# Patient Record
Sex: Male | Born: 1990 | Race: Black or African American | Hispanic: No | Marital: Single | Smoking: Former smoker
Health system: Southern US, Community
[De-identification: ages and names within clinical notes are randomized; demographics above are authoritative.]

## PROBLEM LIST (undated history)

## (undated) DIAGNOSIS — K649 Unspecified hemorrhoids: Secondary | ICD-10-CM

---

## 2012-06-13 ENCOUNTER — Encounter (HOSPITAL_COMMUNITY): Payer: Self-pay | Admitting: Emergency Medicine

## 2012-06-13 ENCOUNTER — Emergency Department (HOSPITAL_COMMUNITY)
Admission: EM | Admit: 2012-06-13 | Discharge: 2012-06-13 | Disposition: A | Discharge status: Home / Self Care | Payer: Self-pay | Attending: Emergency Medicine | Admitting: Emergency Medicine

## 2012-06-13 ENCOUNTER — Emergency Department (HOSPITAL_COMMUNITY): Payer: Self-pay

## 2012-06-13 DIAGNOSIS — Z880 Allergy status to penicillin: Secondary | ICD-10-CM | POA: Insufficient documentation

## 2012-06-13 DIAGNOSIS — S8391XA Sprain of unspecified site of right knee, initial encounter: Secondary | ICD-10-CM

## 2012-06-13 DIAGNOSIS — IMO0002 Reserved for concepts with insufficient information to code with codable children: Secondary | ICD-10-CM | POA: Insufficient documentation

## 2012-06-13 DIAGNOSIS — W010XXA Fall on same level from slipping, tripping and stumbling without subsequent striking against object, initial encounter: Secondary | ICD-10-CM | POA: Insufficient documentation

## 2012-06-13 MED ORDER — HYDROCODONE-ACETAMINOPHEN 5-500 MG PO TABS: 1.0000 | ORAL_TABLET | Freq: Four times a day (QID) | ORAL | Status: DC | PRN

## 2012-06-13 MED ORDER — NAPROXEN 500 MG PO TABS: 500.0000 mg | ORAL_TABLET | Freq: Two times a day (BID) | ORAL | Status: DC

## 2012-06-13 NOTE — Progress Notes (Signed)
Orthopedic Tech Progress Note Patient Details:  Philip York [birth year] [id number]  Ortho Devices Type of Ortho Device: Crutches;Knee Immobilizer Ortho Device/Splint Location: right knee immobilizer Ortho Device/Splint Interventions: Application   Cammer, Mickie Bail 06/13/2012, 11:25 AM

## 2012-06-13 NOTE — ED Notes (Signed)
Pt c/o right knee pain after falling onto knee yesterday

## 2012-06-13 NOTE — ED Provider Notes (Signed)
History     CSN: [id number]  Arrival date & time 06/13/12  6433   First MD Initiated Contact with Patient 06/13/12 0945      No chief complaint on file.   (Consider location/radiation/quality/duration/timing/severity/associated sxs/prior treatment) HPI Comments: Philip York is a 21 y.o. Male who presents with right knee pain. Pt states he was walking yesterday and tripped over his flip flop falling onto hard wood floor. States fell onto right knee. States pain in the knee since then. unable to walk on it. Pain with movement. No obvious swelling or bruising. States could not sleep due to pain. No other complaints or injuries. Denies cold or numbness to the foot.      No past medical history on file.  No past surgical history on file.  No family history on file.  History  Substance Use Topics  . Smoking status: Not on file  . Smokeless tobacco: Not on file  . Alcohol Use: Not on file      Review of Systems  Constitutional: Negative for fever and chills.  HENT: Negative for neck pain and neck stiffness.   Respiratory: Negative.   Cardiovascular: Negative.   Musculoskeletal: Positive for arthralgias.  Skin: Negative.   Neurological: Negative for weakness and numbness.    Allergies  Penicillins and Z-pak  Home Medications  No current outpatient prescriptions on file.  There were no vitals taken for this visit.  Physical Exam  Nursing note and vitals reviewed. Constitutional: He appears well-developed and well-nourished. No distress.       Pt obese  Cardiovascular: Normal rate, regular rhythm and normal heart sounds.   Pulmonary/Chest: Effort normal and breath sounds normal. No respiratory distress. He has no wheezes. He has no rales.  Musculoskeletal:       Right knee normal appearing when compared to the left. No tenderness over anterior knee, patella, patella tendon, or joint spaces. No tenderness over posterior joint. Pain with ROM, unable to flex over 90  degress, full extension. Joint stable with negative anterior, posterior drawer signs, no laxity with medial or lateral stress. Normal and equal dorsal pedal pulses bilaterally  Neurological: He is alert.  Skin: Skin is warm and dry.    ED Course  Procedures (including critical care time)  Pt with right knee injury. Exam shows a stable joint, no swelling, bruising, erythema. X-ray pending.   No results found for this or any previous visit. Dg Knee Complete 4 Views Right  06/13/2012  *RADIOLOGY REPORT*  Clinical Data: , knee pain, fall  RIGHT KNEE - COMPLETE 4+ VIEW  Comparison: None  Findings: Four views of the right knee submitted.  No acute fracture or subluxation.  No radiopaque foreign body.  IMPRESSION: No acute fracture or subluxation.   Original Report Authenticated By: Natasha Mead, M.D.     Pt's x-ray negative. Knee immobilizer and crutches provided. Follow up with orthopedics. Based on my exam, suspect knee sprain.   1. Sprain of right knee       MDM          Lottie Mussel, PA 06/13/12 1629

## 2012-06-13 NOTE — ED Notes (Signed)
Spoke with ortho who will apply knee  immobilizer and crutches

## 2012-06-14 NOTE — ED Provider Notes (Signed)
Medical screening examination/treatment/procedure(s) were performed by non-physician practitioner and as supervising physician I was immediately available for consultation/collaboration.  Jones Skene, M.D.      Jones Skene, MD 06/14/12 1255

## 2017-07-05 ENCOUNTER — Emergency Department
Admission: EM | Admit: 2017-07-05 | Discharge: 2017-07-06 | Disposition: A | Discharge status: Home / Self Care | Payer: Self-pay | Attending: Emergency Medicine | Admitting: Emergency Medicine

## 2017-07-05 ENCOUNTER — Encounter: Payer: Self-pay | Admitting: Emergency Medicine

## 2017-07-05 DIAGNOSIS — K649 Unspecified hemorrhoids: Secondary | ICD-10-CM | POA: Insufficient documentation

## 2017-07-05 DIAGNOSIS — K5900 Constipation, unspecified: Secondary | ICD-10-CM | POA: Insufficient documentation

## 2017-07-05 DIAGNOSIS — F17200 Nicotine dependence, unspecified, uncomplicated: Secondary | ICD-10-CM | POA: Insufficient documentation

## 2017-07-05 MED ORDER — HYDROCORTISONE ACETATE 25 MG RE SUPP: 25.0000 mg | Freq: Two times a day (BID) | RECTAL | 0 refills | Status: DC | PRN

## 2017-07-05 MED ORDER — LIDOCAINE (ANORECTAL) 5 % EX GEL: 1.0000 "application " | Freq: Three times a day (TID) | CUTANEOUS | 0 refills | Status: DC | PRN

## 2017-07-05 NOTE — Discharge Instructions (Signed)
Increase fiber in her diet as well as water intake.  You may take MiraLAX over the next 5-7 days to decrease constipation symptoms.  If symptoms do not improve do not hesitate to follow-up with her primary care or return to the emergency department.

## 2017-07-05 NOTE — ED Triage Notes (Signed)
Pt reports he tried to have a BM today when he was not able to pass the stool easily and when wiping after BM, pt noticed blood on paper towel. Per pt, he has HX of hemorrhoids and sts he has had similar happen in past but this time there was no pain. Per pt, there was only one episode of this .

## 2017-07-05 NOTE — ED Provider Notes (Signed)
[facility] Emergency Department Provider Note   ____________________________________________   I have reviewed the triage vital signs and the nursing notes.   HISTORY  Chief Complaint Constipation    HPI Philip York is a 26 y.o. male presents to the emergency department after seeing bright red blood on toilet paper after attempting a bowel movement at work. Patient was lifting heavy boxes and noted feeling a sharp pain in his abdomen followed by a sense of needing to have a bowel movement. Patient's first attempt for bowel movement was unsuccessful. The second time he passed stool and noticed bright red blood on the toilet paper. Patient noted blood was minimal. Patient reported history of hemorrhoids when he was in high school that resolved with conservative treatment. Patient reported tonight symptoms were similar to symptoms he experienced in high school. Patient came in to be evaluated because his work involves heavy lifting and he was concerned continued lifting tonight could worsen symptoms. Patient reports increased constipation of lately he contributes this to current diet. Patient has no significant medical history and is otherwise healthy. Patient denies fever, chills, headache, vision changes, chest pain, chest tightness, shortness of breath, abdominal pain, nausea and vomiting.  History reviewed. No pertinent past medical history.  There are no active problems to display for this patient.   History reviewed. No pertinent surgical history.  Prior to Admission medications   Medication Sig Start Date End Date Taking? Authorizing Provider  HYDROcodone-acetaminophen (VICODIN) 5-500 MG per tablet Take 1-2 tablets by mouth every 6 (six) hours as needed for pain. 06/13/12   Kirichenko, Lemont Fillersatyana, PA-C  hydrocortisone (ANUSOL-HC) 25 MG suppository Place 1 suppository (25 mg total) rectally 2 (two) times daily as needed for hemorrhoids or anal itching.  07/05/17   Little, Traci M, PA-C  Lidocaine, Anorectal, 5 % GEL Apply 1 application topically 3 (three) times daily as needed. 07/05/17   Little, Traci M, PA-C  naproxen (NAPROSYN) 500 MG tablet Take 1 tablet (500 mg total) by mouth 2 (two) times daily. 06/13/12   Kirichenko, Lemont Fillersatyana, PA-C    Allergies Penicillins and Z-pak [azithromycin]  History reviewed. No pertinent family history.  Social History Social History  Substance Use Topics  . Smoking status: Current Every Day Smoker  . Smokeless tobacco: Never Used  . Alcohol use No    Review of Systems Constitutional: Negative for fever/chills Eyes: No visual changes. Cardiovascular: Denies chest pain. Gastrointestinal: No abdominal pain.  No nausea, vomiting, diarrhea. Positive for constipation. Positive for bright red blood after passing hard stool during a bowel movement. Genitourinary: Negative for dysuria. Musculoskeletal: Negative for back pain. Skin: Negative for rash. Neurological: Negative for headaches. ____________________________________________   PHYSICAL EXAM:  VITAL SIGNS: ED Triage Vitals  Enc Vitals Group     BP 07/05/17 2119 (!) 141/80     Pulse Rate 07/05/17 2119 78     Resp 07/05/17 2119 17     Temp 07/05/17 2119 97.8 F (36.6 C)     Temp src --      SpO2 07/05/17 2119 100 %     Weight 07/05/17 2116 (!) 319 lb (144.7 kg)     Height 07/05/17 2116 5\' 11"  (1.803 m)     Head Circumference --      Peak Flow --      Pain Score --      Pain Loc --      Pain Edu? --      Excl. in GC? --  Constitutional: Alert and oriented. Well appearing and in no acute distress.  Cardiovascular: Normal rate, regular rhythm.  Respiratory: Normal respiratory effort without tachypnea or retractions.  Gastrointestinal: Bowel sounds 4 quadrants. Soft and nontender to palpation. No guarding or rigidity. No palpable masses. No distention. No CVA tenderness. Hemorrhoids noted on exam. No active bleeding.  Neurologic:  Normal speech and language.  Skin:  Skin is warm, dry and intact. No rash noted. Psychiatric: Mood and affect are normal. Speech and behavior are normal. Patient exhibits appropriate insight and judgement.  ____________________________________________   LABS (all labs ordered are listed, but only abnormal results are displayed)  Labs Reviewed - No data to display ____________________________________________  EKG None ____________________________________________  RADIOLOGY None ____________________________________________   PROCEDURES  Procedure(s) performed: no    Critical Care performed: no ____________________________________________   INITIAL IMPRESSION / ASSESSMENT AND PLAN / ED COURSE  Pertinent labs & imaging results that were available during my care of the patient were reviewed by me and considered in my medical decision making (see chart for details).   Patient presents to emergency department with constipation, hemorrhoids and minimal bright red blood following bowel movement. History and physical exam findings are consistent with constipation and hemorrhoids. Patient will be prescribed Anusol and lidocaine gel to be used as needed and advised to increase fiber intake and use MiraLAX as needed for constipation. Recommended patient not return to work this evening and reevaluate symptoms before returning to work his next shift. Patient advised to follow up with PCP as needed or return to the emergency department if symptoms return or worsen. Patient informed of clinical course, understand medical decision-making process, and agree with plan.   ____________________________________________   FINAL CLINICAL IMPRESSION(S) / ED DIAGNOSES  Final diagnoses:  Constipation, unspecified constipation type  Hemorrhoids, unspecified hemorrhoid type       NEW MEDICATIONS STARTED DURING THIS VISIT:  Discharge Medication List as of 07/05/2017 11:46 PM    START taking  these medications   Details  hydrocortisone (ANUSOL-HC) 25 MG suppository Place 1 suppository (25 mg total) rectally 2 (two) times daily as needed for hemorrhoids or anal itching., Starting Tue 07/05/2017, Print    Lidocaine, Anorectal, 5 % GEL Apply 1 application topically 3 (three) times daily as needed., Starting Tue 07/05/2017, Print         Note:  This document was prepared using Dragon voice recognition software and may include unintentional dictation errors.    Little, Jordan Likes, PA-C 07/06/17 0027    Rockne Menghini, MD 07/12/17 2337

## 2017-07-06 NOTE — ED Notes (Signed)
Provider saw the Pt before the nurse could get to the room for assessment.

## 2017-11-02 ENCOUNTER — Encounter: Payer: Self-pay | Admitting: *Deleted

## 2017-11-02 ENCOUNTER — Other Ambulatory Visit: Payer: Self-pay

## 2017-11-02 ENCOUNTER — Emergency Department
Admission: EM | Admit: 2017-11-02 | Discharge: 2017-11-03 | Disposition: A | Discharge status: Home / Self Care | Payer: Worker's Compensation | Attending: Emergency Medicine | Admitting: Emergency Medicine

## 2017-11-02 ENCOUNTER — Emergency Department: Payer: Worker's Compensation

## 2017-11-02 DIAGNOSIS — Z79899 Other long term (current) drug therapy: Secondary | ICD-10-CM | POA: Insufficient documentation

## 2017-11-02 DIAGNOSIS — Y939 Activity, unspecified: Secondary | ICD-10-CM | POA: Diagnosis not present

## 2017-11-02 DIAGNOSIS — F17200 Nicotine dependence, unspecified, uncomplicated: Secondary | ICD-10-CM | POA: Insufficient documentation

## 2017-11-02 DIAGNOSIS — S8011XA Contusion of right lower leg, initial encounter: Secondary | ICD-10-CM | POA: Insufficient documentation

## 2017-11-02 DIAGNOSIS — Y929 Unspecified place or not applicable: Secondary | ICD-10-CM | POA: Insufficient documentation

## 2017-11-02 DIAGNOSIS — S8991XA Unspecified injury of right lower leg, initial encounter: Secondary | ICD-10-CM | POA: Diagnosis present

## 2017-11-02 DIAGNOSIS — Y990 Civilian activity done for income or pay: Secondary | ICD-10-CM | POA: Diagnosis not present

## 2017-11-02 DIAGNOSIS — R1031 Right lower quadrant pain: Secondary | ICD-10-CM | POA: Insufficient documentation

## 2017-11-02 DIAGNOSIS — W230XXA Caught, crushed, jammed, or pinched between moving objects, initial encounter: Secondary | ICD-10-CM | POA: Diagnosis not present

## 2017-11-02 MED ORDER — HYDROCODONE-ACETAMINOPHEN 5-325 MG PO TABS: 1.0000 | ORAL_TABLET | Freq: Four times a day (QID) | ORAL | 0 refills | Status: AC | PRN

## 2017-11-02 MED ORDER — HYDROCODONE-ACETAMINOPHEN 5-325 MG PO TABS
1.0000 | ORAL_TABLET | Freq: Once | ORAL | Status: AC
  Administered 2017-11-02: 1 via ORAL
  Filled 2017-11-02: qty 1

## 2017-11-02 NOTE — ED Notes (Signed)
Supervisor here to confirm ID for WC profile per [employer]

## 2017-11-02 NOTE — ED Provider Notes (Signed)
[facility] Emergency Department Provider Note  ____________________________________________  Time seen: Approximately 11:52 PM  I have reviewed the triage vital signs and the nursing notes.   HISTORY  Chief Complaint Leg Pain    HPI Philip ReeksKevin York is a 27 y.o. male presents to the emergency department with 10 out of 10 right lower shank pain after patient reports that he was pinned between a wall and a forklift.  Patient denies radiculopathy or changes in sensation of the lower extremities.  He denies prior trauma to right shank.  No alleviating medications were attempted prior to presenting to the emergency department.   History reviewed. No pertinent past medical history.  There are no active problems to display for this patient.   History reviewed. No pertinent surgical history.  Prior to Admission medications   Medication Sig Start Date End Date Taking? Authorizing Provider  HYDROcodone-acetaminophen (NORCO) 5-325 MG tablet Take 1 tablet by mouth every 6 (six) hours as needed for up to 2 days for moderate pain. 11/02/17 11/04/17  Orvil FeilWoods, Dyani Babel M, PA-C  hydrocortisone (ANUSOL-HC) 25 MG suppository Place 1 suppository (25 mg total) rectally 2 (two) times daily as needed for hemorrhoids or anal itching. 07/05/17   Little, Traci M, PA-C  Lidocaine, Anorectal, 5 % GEL Apply 1 application topically 3 (three) times daily as needed. 07/05/17   Little, Traci M, PA-C  naproxen (NAPROSYN) 500 MG tablet Take 1 tablet (500 mg total) by mouth 2 (two) times daily. 06/13/12   Kirichenko, Lemont Fillersatyana, PA-C    Allergies Penicillins and Z-pak [azithromycin]  History reviewed. No pertinent family history.  Social History Social History   Tobacco Use  . Smoking status: Current Every Day Smoker  . Smokeless tobacco: Never Used  Substance Use Topics  . Alcohol use: No  . Drug use: No     Review of Systems  Constitutional: No fever/chills Eyes: No visual changes. No  discharge ENT: No upper respiratory complaints. Cardiovascular: no chest pain. Respiratory: no cough. No SOB. Musculoskeletal: Patient has right lower shank pain.  Skin: Negative for rash, abrasions, lacerations, ecchymosis. Neurological: Negative for headaches, focal weakness or numbness.  ____________________________________________   PHYSICAL EXAM:  VITAL SIGNS: ED Triage Vitals  Enc Vitals Group     BP 11/02/17 2155 (!) 157/89     Pulse Rate 11/02/17 2155 74     Resp 11/02/17 2155 16     Temp 11/02/17 2155 98.3 F (36.8 C)     Temp Source 11/02/17 2155 Oral     SpO2 11/02/17 2155 100 %     Weight 11/02/17 2156 300 lb (136.1 kg)     Height 11/02/17 2156 6' (1.829 m)     Head Circumference --      Peak Flow --      Pain Score 11/02/17 2156 10     Pain Loc --      Pain Edu? --      Excl. in GC? --      Constitutional: Alert and oriented. Well appearing and in no acute distress. Eyes: Conjunctivae are normal. PERRL. EOMI. Head: Atraumatic. Cardiovascular: Normal rate, regular rhythm. Normal S1 and S2.  Good peripheral circulation. Respiratory: Normal respiratory effort without tachypnea or retractions. Lungs CTAB. Good air entry to the bases with no decreased or absent breath sounds. Musculoskeletal: Patient has tenderness elicited with palpation over the anterior shank.  Compartments are soft and there is a palpable dorsalis pedis pulse. Neurologic:  Normal speech and language. No gross  focal neurologic deficits are appreciated.  Skin: Skin overlying right shank is warm.  ____________________________________________   LABS (all labs ordered are listed, but only abnormal results are displayed)  Labs Reviewed - No data to display ____________________________________________  EKG   ____________________________________________  RADIOLOGY Geraldo Pitter, personally viewed and evaluated these images (plain radiographs) as part of my medical decision making, as  well as reviewing the written report by the radiologist.  Dg Tibia/fibula Right  Result Date: 11/02/2017 CLINICAL DATA:  Right lower extremity struck by forklift at work. Swelling and bruising. Increased pain with weight-bearing. EXAM: RIGHT TIBIA AND FIBULA - 2 VIEW COMPARISON:  None. FINDINGS: There is no evidence of fracture or other focal bone lesions. Ankle and knee alignment is maintained. Soft tissue edema without radiopaque foreign body or soft tissue air. IMPRESSION: Soft tissue edema without radiopaque foreign body or osseous abnormality. Electronically Signed   By: Rubye Oaks M.D.   On: 11/02/2017 22:26    ____________________________________________    PROCEDURES  Procedure(s) performed:    Procedures    Medications  HYDROcodone-acetaminophen (NORCO/VICODIN) 5-325 MG per tablet 1 tablet (1 tablet Oral Given 11/02/17 2315)     ____________________________________________   INITIAL IMPRESSION / ASSESSMENT AND PLAN / ED COURSE  Pertinent labs & imaging results that were available during my care of the patient were reviewed by me and considered in my medical decision making (see chart for details).  Review of the [place] CSRS was performed in accordance of the [facility] prior to dispensing any controlled drugs.     Assessment and plan Right lower leg contusion Patient presents to the emergency department after being pinned between a forklift and a wall.  Differential diagnosis included compartment syndrome, fracture and shank contusion.  X-ray examination revealed no acute fractures or bony abnormalities.  Physical exam revealed warm skin and soft compartments with a palpable dorsalis pedis pulse.  Strict return precautions were given regarding compartment syndrome and patient voiced understanding twice.  Patient was discharged with a short course of Norco and was given Norco in the emergency department.  All patient questions were  answered.     ____________________________________________  FINAL CLINICAL IMPRESSION(S) / ED DIAGNOSES  Final diagnoses:  Contusion of right lower extremity, initial encounter      NEW MEDICATIONS STARTED DURING THIS VISIT:  ED Discharge Orders        Ordered    HYDROcodone-acetaminophen (NORCO) 5-325 MG tablet  Every 6 hours PRN     11/02/17 2302          This chart was dictated using voice recognition software/Dragon. Despite best efforts to proofread, errors can occur which can change the meaning. Any change was purely unintentional.    Orvil Feil, PA-C 11/02/17 2356    Myrna Blazer, MD 11/03/17 [phone]

## 2017-11-02 NOTE — ED Notes (Signed)
Pt to do a UDS and breath analysis.  Pt does not have ID on him.  [place] a [place] and return with it.

## 2017-11-02 NOTE — ED Notes (Signed)
Pt has swelling on the inside area of calf on right leg. On palpation a small firm circular knot can be felt that is around 1.5" in diameter. Pt states due to weight he has not placed any weight on the right leg.

## 2017-11-02 NOTE — ED Triage Notes (Signed)
Pt to Ed report he was at work when a fork lift hit him in the right shin and lower leg. Small amount of swelling noted and bruising. Pt reports increased pain when bearing weight on right leg. Pt able to move right toes and foot.

## 2017-11-02 NOTE — ED Notes (Signed)
WC contact Nicholas on profile states will contact his management in regards to proceeding with pt UDS d/t no photo ID available

## 2017-11-02 NOTE — Discharge Instructions (Signed)
Return to the emergency department immediately if right lower leg becomes hard, cold or pain worsens in severity.

## 2017-11-03 NOTE — ED Notes (Signed)
WC profile completed by Philip York, EDT

## 2017-11-03 NOTE — ED Notes (Addendum)
Breathanalysis done; test no 0722

## 2017-11-03 NOTE — ED Notes (Signed)

## 2017-11-03 NOTE — ED Notes (Addendum)
W/C UDS urine specimen collected and dropped off in lab per [facility] chain of custody protocol Specimen ID no: [id number]

## 2017-11-07 ENCOUNTER — Emergency Department
Admission: EM | Admit: 2017-11-07 | Discharge: 2017-11-07 | Disposition: A | Discharge status: Home / Self Care | Payer: Worker's Compensation | Attending: Emergency Medicine | Admitting: Emergency Medicine

## 2017-11-07 ENCOUNTER — Other Ambulatory Visit: Payer: Self-pay

## 2017-11-07 ENCOUNTER — Emergency Department: Payer: Worker's Compensation

## 2017-11-07 DIAGNOSIS — S8991XA Unspecified injury of right lower leg, initial encounter: Secondary | ICD-10-CM | POA: Diagnosis present

## 2017-11-07 DIAGNOSIS — Y990 Civilian activity done for income or pay: Secondary | ICD-10-CM | POA: Diagnosis not present

## 2017-11-07 DIAGNOSIS — F17200 Nicotine dependence, unspecified, uncomplicated: Secondary | ICD-10-CM | POA: Diagnosis not present

## 2017-11-07 DIAGNOSIS — Z79899 Other long term (current) drug therapy: Secondary | ICD-10-CM | POA: Insufficient documentation

## 2017-11-07 DIAGNOSIS — Y9259 Other trade areas as the place of occurrence of the external cause: Secondary | ICD-10-CM | POA: Diagnosis not present

## 2017-11-07 DIAGNOSIS — W230XXA Caught, crushed, jammed, or pinched between moving objects, initial encounter: Secondary | ICD-10-CM | POA: Insufficient documentation

## 2017-11-07 DIAGNOSIS — M79661 Pain in right lower leg: Secondary | ICD-10-CM | POA: Diagnosis not present

## 2017-11-07 DIAGNOSIS — M79604 Pain in right leg: Secondary | ICD-10-CM

## 2017-11-07 DIAGNOSIS — Y9389 Activity, other specified: Secondary | ICD-10-CM | POA: Diagnosis not present

## 2017-11-07 LAB — BASIC METABOLIC PANEL
ANION GAP: 8 (ref 5–15)
BUN: 20 mg/dL (ref 6–20)
CALCIUM: 9.3 mg/dL (ref 8.9–10.3)
CO2: 25 mmol/L (ref 22–32)
Chloride: 105 mmol/L (ref 101–111)
Creatinine, Ser: 0.69 mg/dL (ref 0.61–1.24)
GFR calc non Af Amer: >60 mL/min (ref >60)
GLUCOSE: 95 mg/dL (ref 65–99)
POTASSIUM: 4.6 mmol/L (ref 3.5–5.1)
Sodium: 138 mmol/L (ref 135–145)

## 2017-11-07 LAB — CBC
HCT: 44.8 % (ref 40.0–52.0)
Hemoglobin: 15 g/dL (ref 13.0–18.0)
MCH: 30.4 pg (ref 26.0–34.0)
MCHC: 33.5 g/dL (ref 32.0–36.0)
MCV: 90.7 fL (ref 80.0–100.0)
Platelets: 232 10*3/uL (ref 150–440)
RBC: 4.94 MIL/uL (ref 4.40–5.90)
RDW: 14 % (ref 11.5–14.5)
WBC: 4.6 10*3/uL (ref 3.8–10.6)

## 2017-11-07 LAB — CK: CK TOTAL: 429 U/L — AB (ref 49–397)

## 2017-11-07 MED ORDER — OXYCODONE-ACETAMINOPHEN 5-325 MG PO TABS: 1.0000 | ORAL_TABLET | ORAL | 0 refills | Status: AC | PRN

## 2017-11-07 MED ORDER — IBUPROFEN 800 MG PO TABS: 800.0000 mg | ORAL_TABLET | Freq: Three times a day (TID) | ORAL | 0 refills | Status: DC | PRN

## 2017-11-07 NOTE — ED Provider Notes (Signed)
[facility] Emergency Department Provider Note  ____________________________________________  Time seen: Approximately 11:17 AM  I have reviewed the triage vital signs and the nursing notes.   HISTORY  Chief Complaint Medication Refill    HPI Philip York is a 27 y.o. male that presents to the evaluation after leg injury 4 days ago.  Pain is primarily over the distal shin.  It is worse when he tries to move his foot.  He is afraid to bear weight due to pain.  Patient had xrays completed after the injury in the ED. He returned to the emergency department for additional time off work and pain medication.  No numbness, tingling.   History reviewed. No pertinent past medical history.  There are no active problems to display for this patient.   History reviewed. No pertinent surgical history.  Prior to Admission medications   Medication Sig Start Date End Date Taking? Authorizing Provider  hydrocortisone (ANUSOL-HC) 25 MG suppository Place 1 suppository (25 mg total) rectally 2 (two) times daily as needed for hemorrhoids or anal itching. 07/05/17   Little, Traci M, PA-C  ibuprofen (ADVIL,MOTRIN) 800 MG tablet Take 1 tablet (800 mg total) by mouth every 8 (eight) hours as needed. 11/07/17   Enid DerryWagner, Gladstone Rosas, PA-C  Lidocaine, Anorectal, 5 % GEL Apply 1 application topically 3 (three) times daily as needed. 07/05/17   Little, Traci M, PA-C  naproxen (NAPROSYN) 500 MG tablet Take 1 tablet (500 mg total) by mouth 2 (two) times daily. 06/13/12   Kirichenko, Lemont Fillersatyana, PA-C  oxyCODONE-acetaminophen (PERCOCET) 5-325 MG tablet Take 1 tablet by mouth every 4 (four) hours as needed for up to 2 days for severe pain. 11/07/17 11/09/17  Enid DerryWagner, Baillie Mohammad, PA-C    Allergies Penicillins and Z-pak [azithromycin]  No family history on file.  Social History Social History   Tobacco Use  . Smoking status: Current Every Day Smoker  . Smokeless tobacco: Never Used  Substance Use Topics   . Alcohol use: No  . Drug use: No     Review of Systems  Cardiovascular: No chest pain. Respiratory: No SOB. Gastrointestinal: No abdominal pain.  No nausea, no vomiting.  Musculoskeletal: Positive for shin pain. Skin: Negative for rash, abrasions, lacerations, ecchymosis. Neurological: Negative for numbness or tingling   ____________________________________________   PHYSICAL EXAM:  VITAL SIGNS: ED Triage Vitals  Enc Vitals Group     BP 11/07/17 1023 (!) 150/82     Pulse Rate 11/07/17 1023 63     Resp 11/07/17 1023 18     Temp 11/07/17 1023 98 F (36.7 C)     Temp Source 11/07/17 1023 Oral     SpO2 11/07/17 1023 99 %     Weight 11/07/17 1016 300 lb (136.1 kg)     Height 11/07/17 1016 6' (1.829 m)     Head Circumference --      Peak Flow --      Pain Score --      Pain Loc --      Pain Edu? --      Excl. in [place]? --      Constitutional: Alert and oriented. Well appearing and in no acute distress. Eyes: Conjunctivae are normal. PERRL. EOMI. Head: Atraumatic. ENT:      Ears:      Nose: No congestion/rhinnorhea.      Mouth/Throat: Mucous membranes are moist.  Neck: No stridor.  Cardiovascular: Normal rate, regular rhythm.  Good peripheral circulation.  Symmetric dorsalis pedis pulses bilaterally.  Respiratory: Normal respiratory effort without tachypnea or retractions. Lungs CTAB. Good air entry to the bases with no decreased or absent breath sounds. Musculoskeletal: Full range of motion to all extremities. No gross deformities appreciated.  Patient able to perform resisted flexion and extension of right foot without difficulty.  Mild tenderness to palpation over right shin.  Compartments are soft. Neurologic:  Normal speech and language. No gross focal neurologic deficits are appreciated.  Skin:  Skin is warm, dry and intact. No rash noted.   ____________________________________________   LABS (all labs ordered are listed, but only abnormal results are  displayed)  Labs Reviewed  CK - Abnormal; Notable for the following components:      Result Value   Total CK 429 (*)    All other components within normal limits  CBC  BASIC METABOLIC PANEL   ____________________________________________  EKG   ____________________________________________  RADIOLOGY   US Venous Img Lower Unilateral Right  Result Date: 11/07/2017 CLINICAL DATA:  Injury to right lower leg 4 days ago with worsening pain. EXAM: RIGHT LOWER EXTREMITY VENOUS DOPPLER ULTRASOUND TECHNIQUE: Gray-scale sonography with graded compression, as well as color Doppler and duplex ultrasound were performed to evaluate the lower extremity deep venous systems from the level of the common femoral vein and including the common femoral, femoral, profunda femoral, popliteal and calf veins including the posterior tibial, peroneal and gastrocnemius veins when visible. The superficial great saphenous vein was also interrogated. Spectral Doppler was utilized to evaluate flow at rest and with distal augmentation maneuvers in the common femoral, femoral and popliteal veins. COMPARISON:  None. FINDINGS: Contralateral Common Femoral Vein: Respiratory phasicity is normal and symmetric with the symptomatic side. No evidence of thrombus. Normal compressibility. Common Femoral Vein: No evidence of thrombus. Normal compressibility, respiratory phasicity and response to augmentation. Saphenofemoral Junction: No evidence of thrombus. Normal compressibility and flow on color Doppler imaging. Profunda Femoral Vein: No evidence of thrombus. Normal compressibility and flow on color Doppler imaging. Femoral Vein: No evidence of thrombus. Normal compressibility, respiratory phasicity and response to augmentation. Popliteal Vein: No evidence of thrombus. Normal compressibility, respiratory phasicity and response to augmentation. Calf Veins: No evidence of thrombus. Normal compressibility and flow on color Doppler imaging.  Superficial Great Saphenous Vein: No evidence of thrombus. Normal compressibility. Venous Reflux:  None. Other Findings: No evidence of superficial thrombophlebitis or abnormal fluid collection. IMPRESSION: No evidence of right lower extremity deep venous thrombosis. Electronically Signed   By: Irish Lack M.D.   On: 11/07/2017 12:24    ____________________________________________    PROCEDURES  Procedure(s) performed:    Procedures    Medications - No data to display   ____________________________________________   INITIAL IMPRESSION / ASSESSMENT AND PLAN / ED COURSE  Pertinent labs & imaging results that were available during my care of the patient were reviewed by me and considered in my medical decision making (see chart for details).  Review of the [place] CSRS was performed in accordance of the [facility] prior to dispensing any controlled drugs.  Patient presents emergency department for reevaluation after crush injury 4 days ago with a forklift.  Patient returned to the emergency department for additional time off work and pain medication.  Patient had x-rays completed 4 days ago.  No DVT on ultrasound.  Compartment syndrome is unlikely as patient is able to perform resisted extension and flexion of foot without difficulty and compartments are soft.  CK is mildly elevated, which could be due to injury.  Patient will be  discharged home with prescriptions for a short course of Percocet and ibuprofen.  Patient is to follow up with PCP as directed. Patient is given ED precautions to return to the ED for any worsening or new symptoms.     ____________________________________________  FINAL CLINICAL IMPRESSION(S) / ED DIAGNOSES  Final diagnoses:  Pain of right lower extremity      NEW MEDICATIONS STARTED DURING THIS VISIT:  ED Discharge Orders        Ordered    oxyCODONE-acetaminophen (PERCOCET) 5-325 MG tablet  Every 4 hours PRN     11/07/17 1421    ibuprofen  (ADVIL,MOTRIN) 800 MG tablet  Every 8 hours PRN     11/07/17 1421          This chart was dictated using voice recognition software/Dragon. Despite best efforts to proofread, errors can occur which can change the meaning. Any change was purely unintentional.    Enid Derry, PA-C 11/07/17 1841    Rockne Menghini, MD 11/08/17 1030

## 2017-11-07 NOTE — ED Triage Notes (Signed)
Pt states he was seen here on 2/20 for a Contusion to the RLE and is here for pain med refill.

## 2017-11-07 NOTE — ED Notes (Signed)
See triage note  States he was seen last week after getting his right struck by a forklift  Was dx'd with contusion to leg  States he is having more pain and unable to bear full wt

## 2018-05-14 IMAGING — CR DG TIBIA/FIBULA 2V*R*
3 series · 3 of 3 positions shown · non-contrast
Comparison: None.

CLINICAL DATA: Right lower extremity struck by forklift at work.
Swelling and bruising. Increased pain with weight-bearing.

EXAM:
RIGHT TIBIA AND FIBULA - 2 VIEW

[tibia ap]
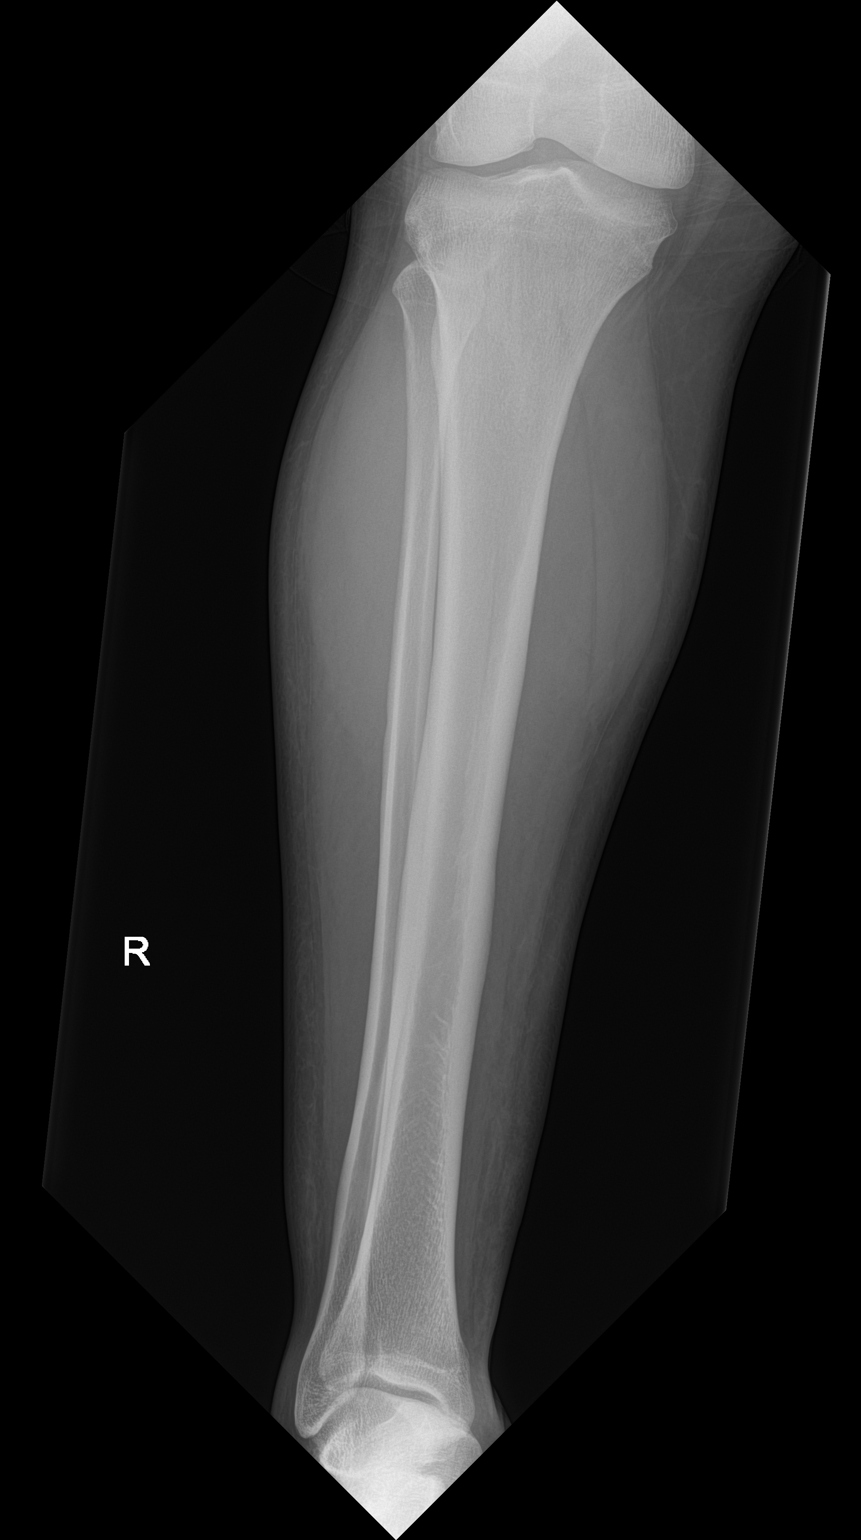

[tibia lat (1 of 2)]
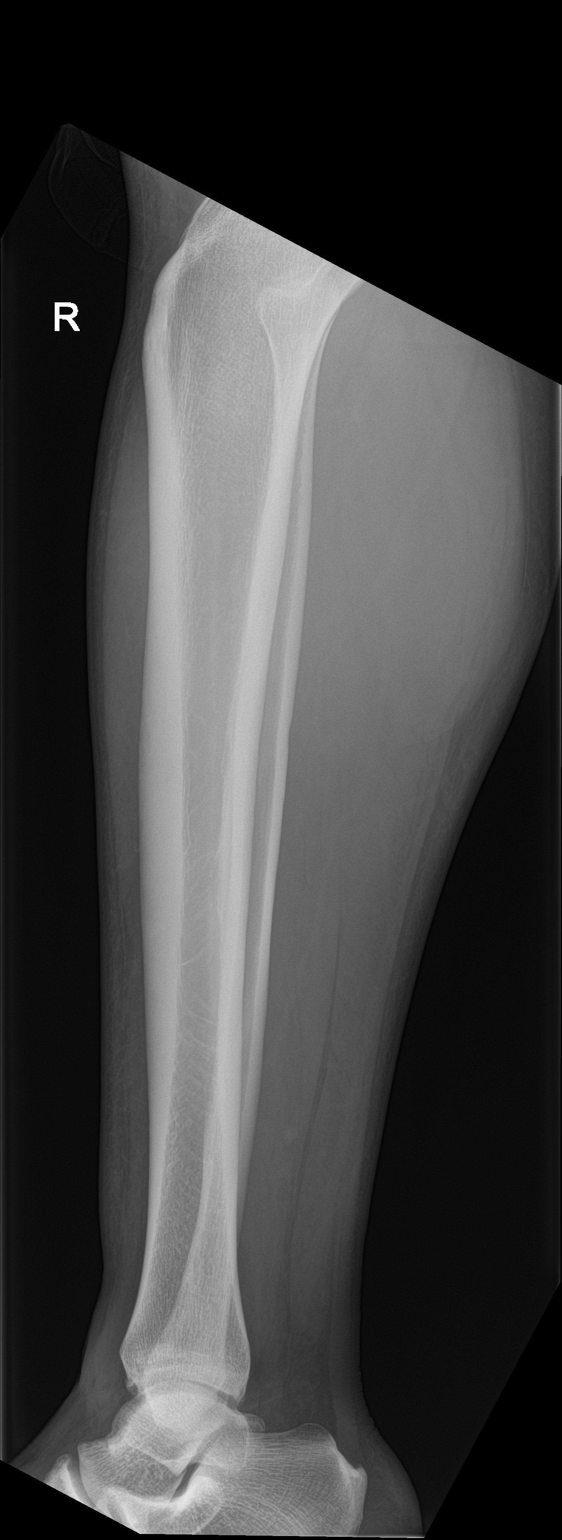

[tibia lat (2 of 2)]
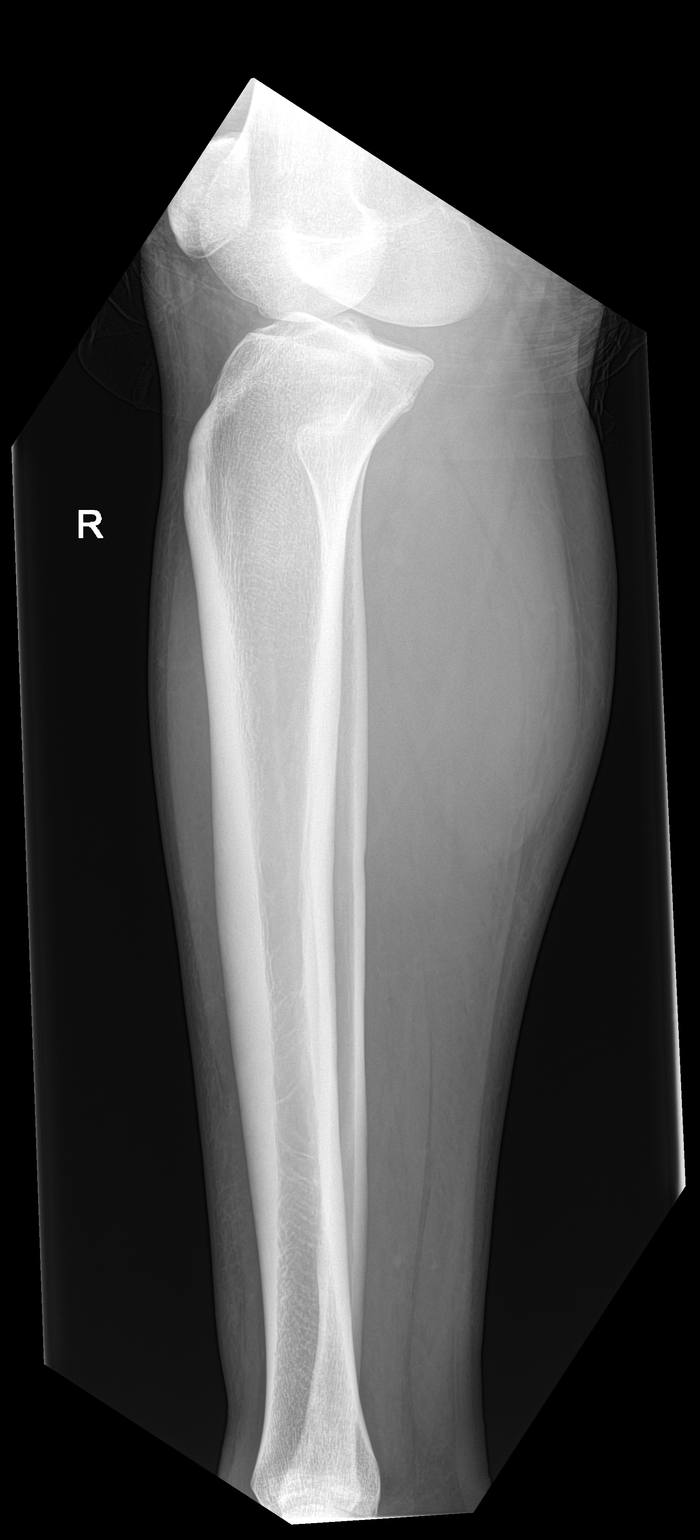

[3 of 3 positions shown; findings below may reference images not displayed]

FINDINGS: There is no evidence of fracture or other focal bone lesions. Ankle
and knee alignment is maintained. Soft tissue edema without
radiopaque foreign body or soft tissue air.
IMPRESSION: Soft tissue edema without radiopaque foreign body or osseous
abnormality.

## 2019-03-26 ENCOUNTER — Ambulatory Visit
Admission: EM | Admit: 2019-03-26 | Discharge: 2019-03-26 | Disposition: A | Discharge status: Home / Self Care | Payer: Self-pay | Attending: Family Medicine | Admitting: Family Medicine

## 2019-03-26 ENCOUNTER — Encounter: Payer: Self-pay | Admitting: Emergency Medicine

## 2019-03-26 ENCOUNTER — Other Ambulatory Visit: Payer: Self-pay

## 2019-03-26 DIAGNOSIS — K644 Residual hemorrhoidal skin tags: Secondary | ICD-10-CM

## 2019-03-26 NOTE — ED Triage Notes (Signed)
Patient states he has had blood in his stool previously and it just happened again yesterday.

## 2019-03-26 NOTE — ED Provider Notes (Signed)
[facility]    CSN: [id number] Arrival date & time: 03/26/19  1132     History   Chief Complaint Chief Complaint  Patient presents with  . Blood In Stools    HPI Javarian Jakubiak is a 28 y.o. male.   28 yo male with a c/o bright red blood on toilet paper and in toilet twice over the past week. States that since yesterday it has resolved. Denies any abdominal pain, fevers, chills, vomiting. States he felt some mild anal pain last week and also states that he has a h/o hemorrhoids and mild constipation.      History reviewed. No pertinent past medical history.  There are no active problems to display for this patient.   History reviewed. No pertinent surgical history.     Home Medications    Prior to Admission medications   Medication Sig Start Date End Date Taking? Authorizing Provider  hydrocortisone (ANUSOL-HC) 25 MG suppository Place 1 suppository (25 mg total) rectally 2 (two) times daily as needed for hemorrhoids or anal itching. 07/05/17   Little, Traci M, PA-C  ibuprofen (ADVIL,MOTRIN) 800 MG tablet Take 1 tablet (800 mg total) by mouth every 8 (eight) hours as needed. 11/07/17   Laban Emperor, PA-C  Lidocaine, Anorectal, 5 % GEL Apply 1 application topically 3 (three) times daily as needed. 07/05/17   Little, Traci M, PA-C  naproxen (NAPROSYN) 500 MG tablet Take 1 tablet (500 mg total) by mouth 2 (two) times daily. 06/13/12   Jeannett Senior, PA-C    Family History History reviewed. No pertinent family history.  Social History Social History   Tobacco Use  . Smoking status: Current Every Day Smoker  . Smokeless tobacco: Never Used  Substance Use Topics  . Alcohol use: No  . Drug use: No     Allergies   Penicillins and Z-pak [azithromycin]   Review of Systems Review of Systems   Physical Exam Triage Vital Signs ED Triage Vitals  Enc Vitals Group     BP 03/26/19 1211 (!) 142/75     Pulse Rate 03/26/19 1211 70     Resp 03/26/19  1211 16     Temp 03/26/19 1211 98.4 F (36.9 C)     Temp Source 03/26/19 1211 Oral     SpO2 03/26/19 1211 100 %     Weight 03/26/19 1210 (!) 320 lb (145.2 kg)     Height 03/26/19 1210 6' (1.829 m)     Head Circumference --      Peak Flow --      Pain Score 03/26/19 1210 0     Pain Loc --      Pain Edu? --      Excl. in [place]? --    No data found.  Updated Vital Signs BP (!) 142/75   Pulse 70   Temp 98.4 F (36.9 C) (Oral)   Resp 16   Ht 6' (1.829 m)   Wt (!) 145.2 kg   SpO2 100%   BMI 43.40 kg/m   Visual Acuity Right Eye Distance:   Left Eye Distance:   Bilateral Distance:    Right Eye Near:   Left Eye Near:    Bilateral Near:     Physical Exam Vitals signs and nursing note reviewed.  Constitutional:      General: He is not in acute distress.    Appearance: He is not toxic-appearing or diaphoretic.  Abdominal:     General: Bowel sounds are normal. There  is no distension.     Palpations: Abdomen is soft. There is no mass.     Tenderness: There is no abdominal tenderness. There is no right CVA tenderness, left CVA tenderness, guarding or rebound.     Hernia: No hernia is present.  Genitourinary:    Rectum: External hemorrhoid (non-tender) present.  Neurological:     Mental Status: He is alert.      UC Treatments / Results  Labs (all labs ordered are listed, but only abnormal results are displayed) Labs Reviewed - No data to display  EKG   Radiology No results found.  Procedures Procedures (including critical care time)  Medications Ordered in UC Medications - No data to display  Initial Impression / Assessment and Plan / UC Course  I have reviewed the triage vital signs and the nursing notes.  Pertinent labs & imaging results that were available during my care of the patient were reviewed by me and considered in my medical decision making (see chart for details).      Final Clinical Impressions(s) / UC Diagnoses   Final diagnoses:   External hemorrhoid    ED Prescriptions    None      1. diagnosis reviewed with patient 2. Recommend supportive treatment with otc medications, increased water and fiber intake 3. Follow-up prn if symptoms recur   Controlled Substance Prescriptions [place] Controlled Substance Registry consulted? Not Applicable   Payton Mccallumonty, Naureen Benton, MD 03/26/19 1251

## 2019-04-02 ENCOUNTER — Ambulatory Visit
Admission: EM | Admit: 2019-04-02 | Discharge: 2019-04-02 | Disposition: A | Discharge status: Home / Self Care | Payer: Self-pay | Attending: Family Medicine | Admitting: Family Medicine

## 2019-04-02 ENCOUNTER — Other Ambulatory Visit: Payer: Self-pay

## 2019-04-02 DIAGNOSIS — K644 Residual hemorrhoidal skin tags: Secondary | ICD-10-CM

## 2019-04-02 HISTORY — DX: Unspecified hemorrhoids: K64.9

## 2019-04-02 MED ORDER — HYDROCORTISONE (PERIANAL) 2.5 % EX CREA: 1.0000 "application " | TOPICAL_CREAM | Freq: Two times a day (BID) | CUTANEOUS | 0 refills | Status: DC

## 2019-04-02 MED ORDER — PREDNISONE 20 MG PO TABS: 20.0000 mg | ORAL_TABLET | Freq: Every day | ORAL | 0 refills | Status: DC

## 2019-04-02 NOTE — ED Triage Notes (Signed)
Pt reports he was seen here for hemorrhoids last week. States he still can't use the bathroom with a BM without blood either on the tissue or in the toilet

## 2019-04-02 NOTE — ED Provider Notes (Signed)
[facility]    CSN: [id number] Arrival date & time: 04/02/19  1340     History   Chief Complaint Chief Complaint  Patient presents with  . Hemorrhoids    HPI Philip York is a 28 y.o. male.   28 yo male seen last week with an external hemorrhoid presents with a worsening. States after visit here that it seemed to be improving then last couple of days has flared up again.      Past Medical History:  Diagnosis Date  . Hemorrhoids     There are no active problems to display for this patient.   History reviewed. No pertinent surgical history.     Home Medications    Prior to Admission medications   Medication Sig Start Date End Date Taking? Authorizing Provider  hydrocortisone (ANUSOL-HC) 2.5 % rectal cream Place 1 application rectally 2 (two) times daily. 04/02/19   Norval Gable, MD  hydrocortisone (ANUSOL-HC) 25 MG suppository Place 1 suppository (25 mg total) rectally 2 (two) times daily as needed for hemorrhoids or anal itching. 07/05/17   Little, Traci M, PA-C  ibuprofen (ADVIL,MOTRIN) 800 MG tablet Take 1 tablet (800 mg total) by mouth every 8 (eight) hours as needed. 11/07/17   Laban Emperor, PA-C  Lidocaine, Anorectal, 5 % GEL Apply 1 application topically 3 (three) times daily as needed. 07/05/17   Little, Traci M, PA-C  naproxen (NAPROSYN) 500 MG tablet Take 1 tablet (500 mg total) by mouth 2 (two) times daily. 06/13/12   Kirichenko, Tatyana, PA-C  predniSONE (DELTASONE) 20 MG tablet Take 1 tablet (20 mg total) by mouth daily. 04/02/19   Norval Gable, MD    Family History History reviewed. No pertinent family history.  Social History Social History   Tobacco Use  . Smoking status: Current Every Day Smoker    Packs/day: 0.25    Types: Cigarettes  . Smokeless tobacco: Never Used  Substance Use Topics  . Alcohol use: No  . Drug use: Yes    Types: Marijuana    Comment: once daily     Allergies   Penicillins and Z-pak [azithromycin]   Review of Systems Review of Systems   Physical Exam Triage Vital Signs ED Triage Vitals  Enc Vitals Group     BP 04/02/19 1349 (!) 152/74     Pulse Rate 04/02/19 1349 79     Resp 04/02/19 1349 20     Temp 04/02/19 1349 98.1 F (36.7 C)     Temp Source 04/02/19 1349 Oral     SpO2 04/02/19 1349 100 %     Weight 04/02/19 1351 (!) 320 lb (145.1 kg)     Height 04/02/19 1351 6' (1.829 m)     Head Circumference --      Peak Flow --      Pain Score 04/02/19 1350 1     Pain Loc --      Pain Edu? --      Excl. in [place]? --    No data found.  Updated Vital Signs BP (!) 152/74 (BP Location: Right Arm)   Pulse 79   Temp 98.1 F (36.7 C) (Oral)   Resp 20   Ht 6' (1.829 m)   Wt (!) 145.1 kg   SpO2 100%   BMI 43.40 kg/m   Visual Acuity Right Eye Distance:   Left Eye Distance:   Bilateral Distance:    Right Eye Near:   Left Eye Near:    Bilateral Near:  Physical Exam Vitals signs and nursing note reviewed.  Constitutional:      General: He is not in acute distress.    Appearance: He is not toxic-appearing or diaphoretic.  Abdominal:     General: Bowel sounds are normal. There is no distension.     Palpations: Abdomen is soft.  Genitourinary:    Rectum: External hemorrhoid (inflammend; tender) present.  Neurological:     Mental Status: He is alert.      UC Treatments / Results  Labs (all labs ordered are listed, but only abnormal results are displayed) Labs Reviewed - No data to display  EKG   Radiology No results found.  Procedures Procedures (including critical care time)  Medications Ordered in UC Medications - No data to display  Initial Impression / Assessment and Plan / UC Course  I have reviewed the triage vital signs and the nursing notes.  Pertinent labs & imaging results that were available during my care of the patient were reviewed by me and considered in my medical decision making (see chart for details).      Final Clinical  Impressions(s) / UC Diagnoses   Final diagnoses:  Inflamed external hemorrhoid     Discharge Instructions     Tucks Pads     ED Prescriptions    Medication Sig Dispense Auth. Provider   hydrocortisone (ANUSOL-HC) 2.5 % rectal cream Place 1 application rectally 2 (two) times daily. 30 g Payton Mccallumonty, Lenzie Sandler, MD   predniSONE (DELTASONE) 20 MG tablet Take 1 tablet (20 mg total) by mouth daily. 5 tablet Payton Mccallumonty, Caraline Deutschman, MD     1. diagnosis reviewed with patient 2. rx as per orders above; reviewed possible side effects, interactions, risks and benefits  3. Recommend supportive treatment as above 4. Follow-up prn if symptoms worsen or don't improve   Controlled Substance Prescriptions [place] Controlled Substance Registry consulted? Not Applicable   Payton Mccallumonty, Lynnlee Revels, MD 04/02/19 [phone]

## 2019-04-02 NOTE — Discharge Instructions (Signed)
Tuck's Pads

## 2019-04-08 IMAGING — US US EXTREM LOW VENOUS*R*
1 series · 13 of 24 positions shown · non-contrast
Comparison: None.

CLINICAL DATA: Injury to right lower leg 4 days ago with worsening
pain.



[Series 1: us extrem low venous*right* · 0.08mm/px · 13 of 34 slices shown]
[im 1/34]
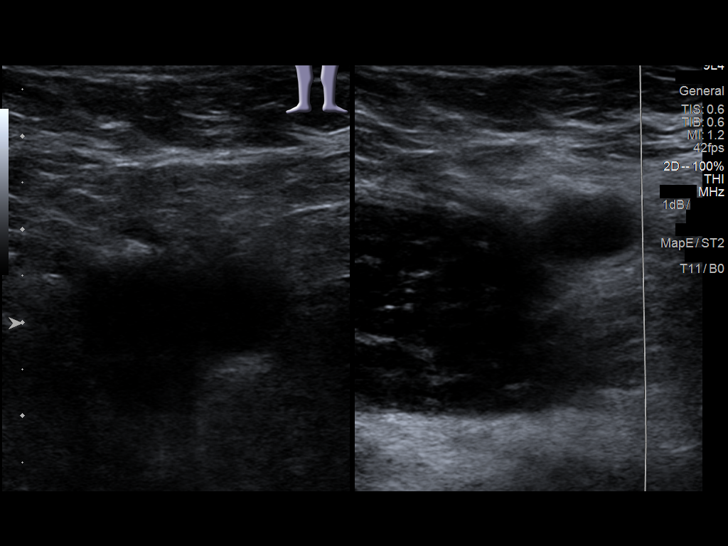
[im 3/34]
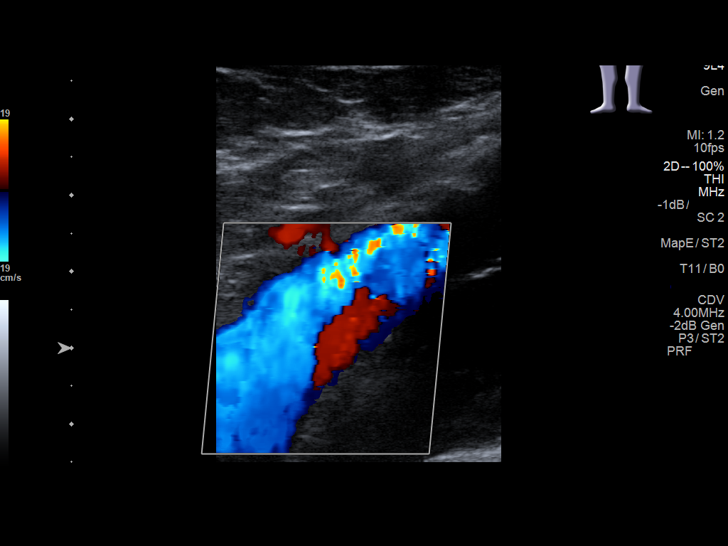
[im 6/34]
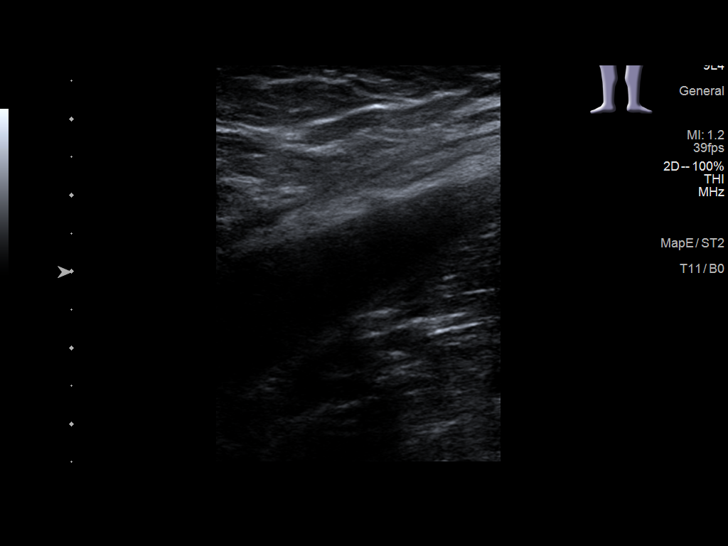
[im 9/34]
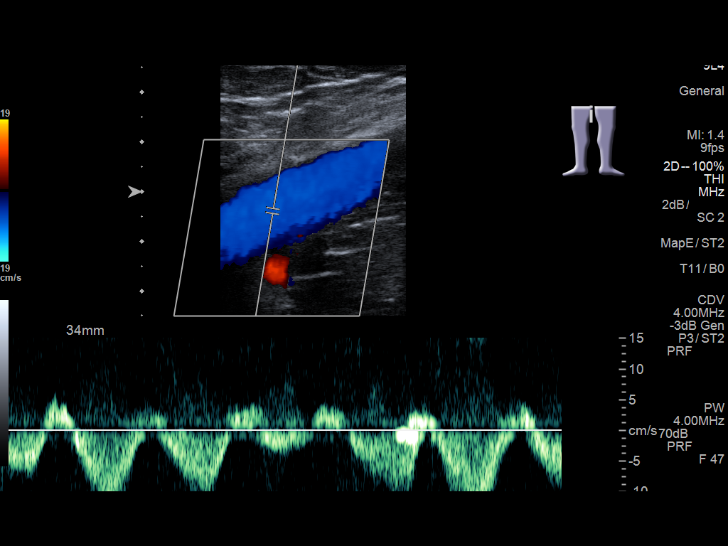
[im 12/34]
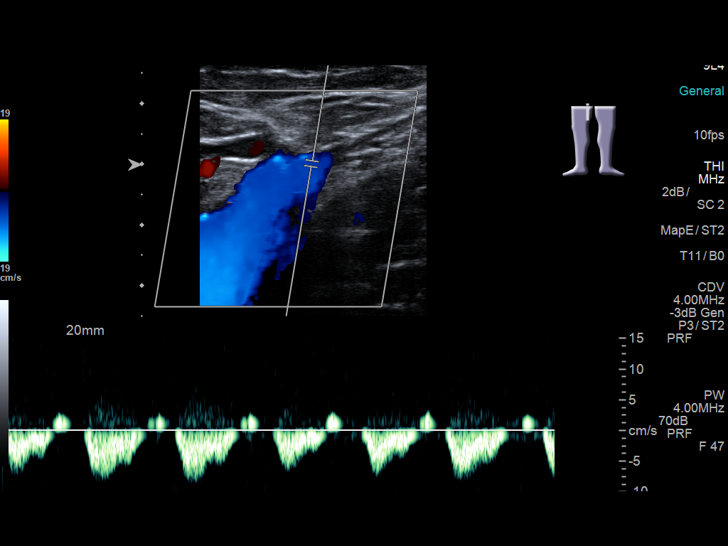
[im 15/34]
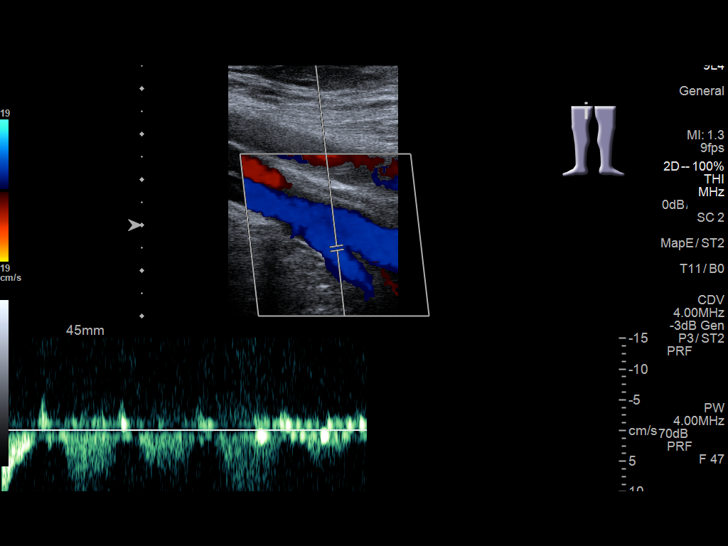
[im 18/34]
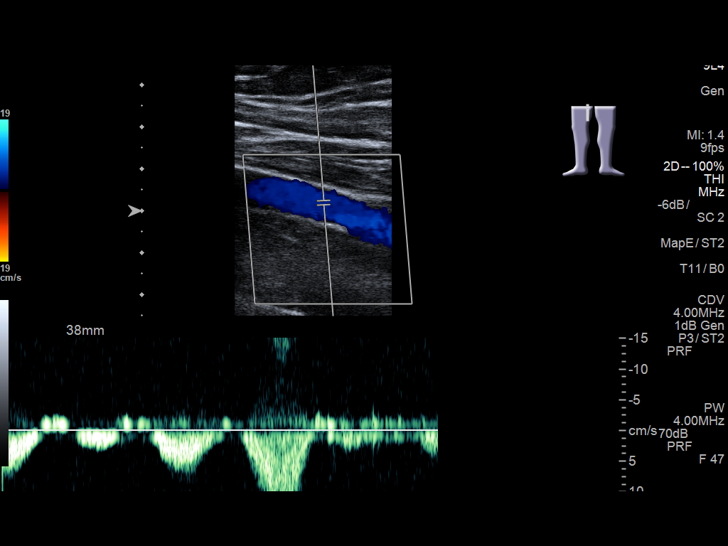
[im 19/34]
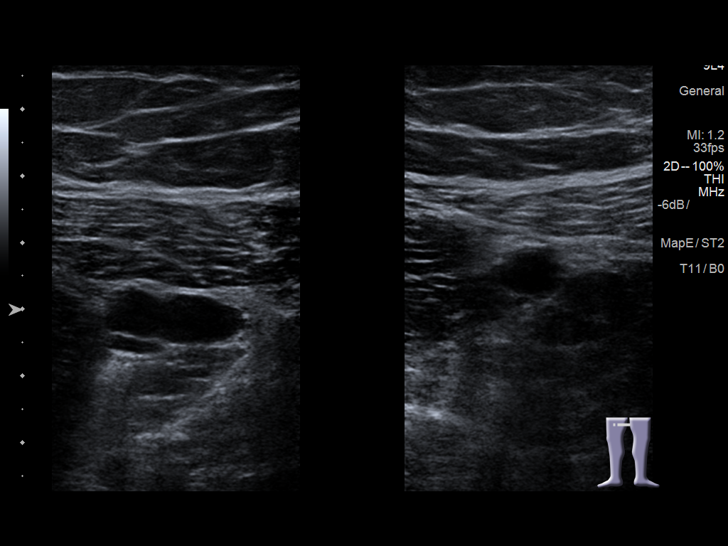
[im 22/34]
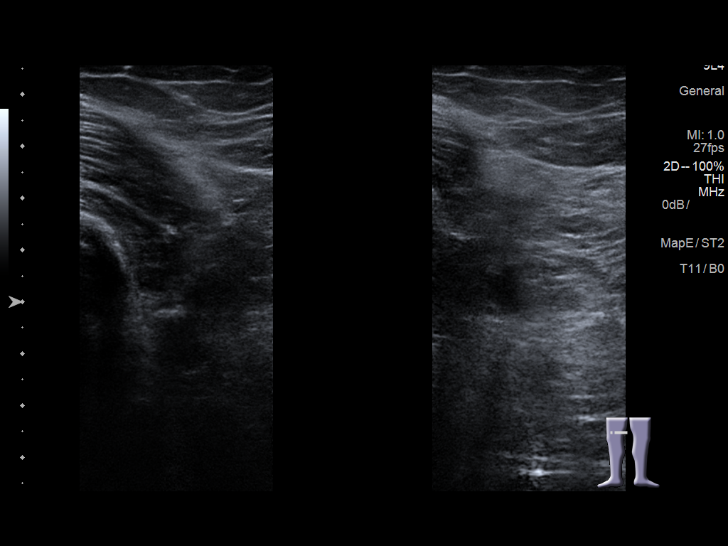
[im 25/34]
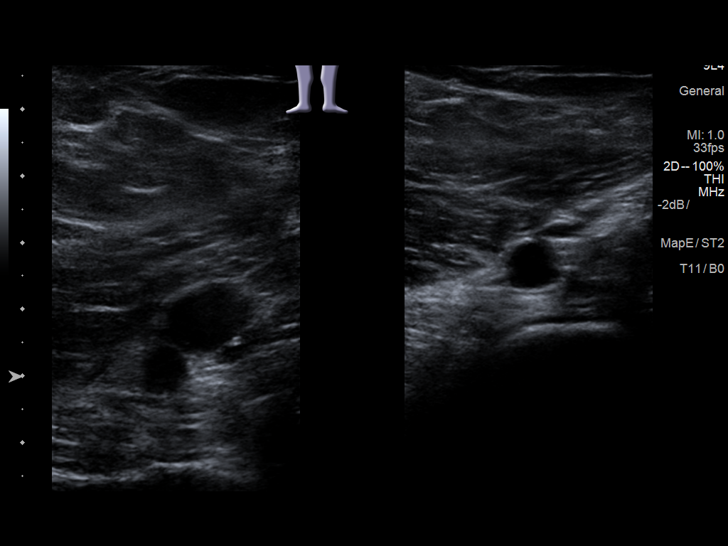
[im 28/34]
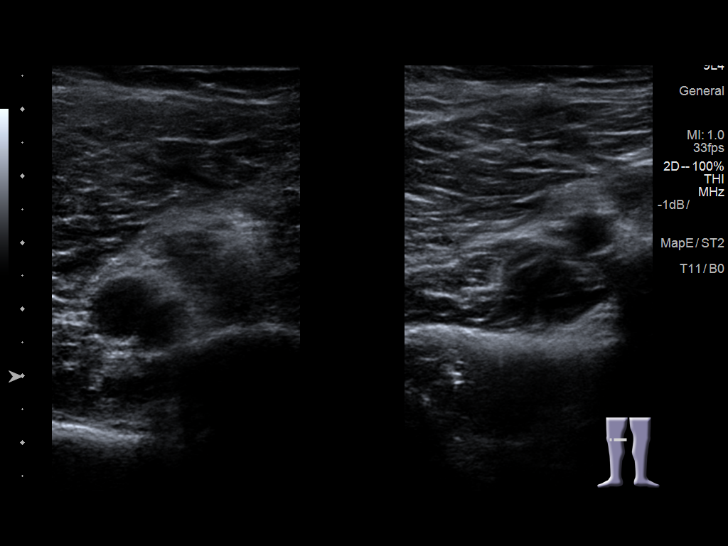
[im 31/34]
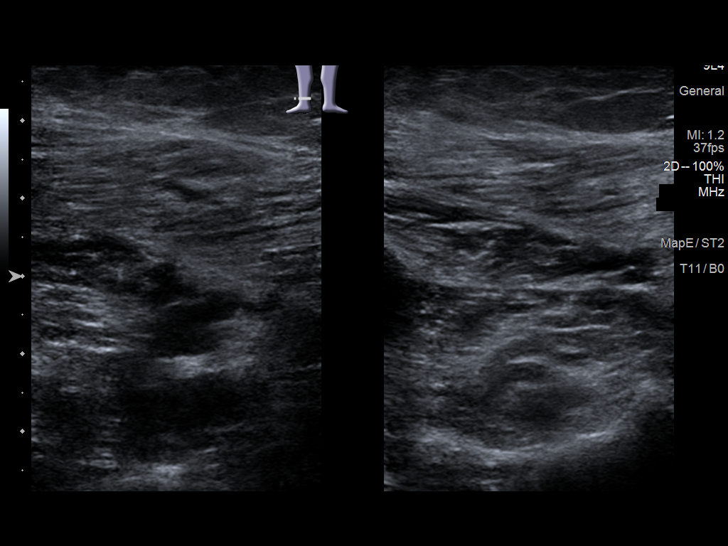
[im 34/34]
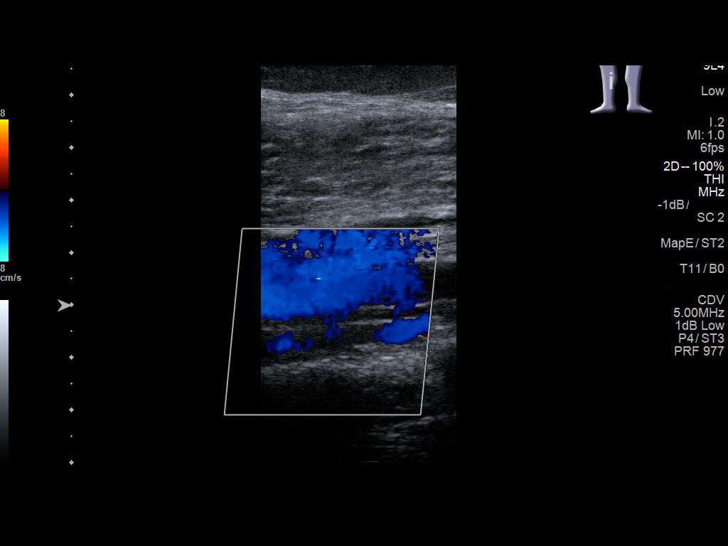

[13 of 24 positions shown; findings below may reference images not displayed]

FINDINGS: Contralateral Common Femoral Vein: Respiratory phasicity is normal
and symmetric with the symptomatic side. No evidence of thrombus.
Normal compressibility.

Common Femoral Vein: No evidence of thrombus. Normal
compressibility, respiratory phasicity and response to augmentation.

Saphenofemoral Junction: No evidence of thrombus. Normal
compressibility and flow on color Doppler imaging.

Profunda Femoral Vein: No evidence of thrombus. Normal
compressibility and flow on color Doppler imaging.

Femoral Vein: No evidence of thrombus. Normal compressibility,
respiratory phasicity and response to augmentation.

Popliteal Vein: No evidence of thrombus. Normal compressibility,
respiratory phasicity and response to augmentation.

Calf Veins: No evidence of thrombus. Normal compressibility and flow
on color Doppler imaging.

Superficial Great Saphenous Vein: No evidence of thrombus. Normal
compressibility.

Venous Reflux:  None.

Other Findings: No evidence of superficial thrombophlebitis or
abnormal fluid collection.
IMPRESSION: No evidence of right lower extremity deep venous thrombosis.

## 2019-12-06 ENCOUNTER — Encounter: Payer: Self-pay | Admitting: Emergency Medicine

## 2019-12-06 ENCOUNTER — Other Ambulatory Visit: Payer: Self-pay

## 2019-12-06 ENCOUNTER — Ambulatory Visit
Admission: EM | Admit: 2019-12-06 | Discharge: 2019-12-06 | Disposition: A | Discharge status: Home / Self Care | Payer: Self-pay | Attending: Emergency Medicine | Admitting: Emergency Medicine

## 2019-12-06 DIAGNOSIS — Z202 Contact with and (suspected) exposure to infections with a predominantly sexual mode of transmission: Secondary | ICD-10-CM | POA: Insufficient documentation

## 2019-12-06 LAB — URINALYSIS, COMPLETE (UACMP) WITH MICROSCOPIC
Bilirubin Urine: NEGATIVE
Glucose, UA: NEGATIVE mg/dL
Hgb urine dipstick: NEGATIVE
Ketones, ur: NEGATIVE mg/dL
Leukocytes,Ua: NEGATIVE
Nitrite: NEGATIVE
Protein, ur: NEGATIVE mg/dL
RBC / HPF: NONE SEEN RBC/hpf (ref 0–5)
Specific Gravity, Urine: 1.025 (ref 1.005–1.030)
pH: 7 (ref 5.0–8.0)

## 2019-12-06 LAB — CHLAMYDIA/NGC RT PCR (ARMC ONLY): N gonorrhoeae: NOT DETECTED

## 2019-12-06 LAB — CHLAMYDIA/NGC RT PCR (ARMC ONLY)??????????: Chlamydia Tr: NOT DETECTED

## 2019-12-06 NOTE — Discharge Instructions (Addendum)
Your testing should be available tomorrow.  If you have a positive result we will have you return for treatment.  If negative no further work-up is required.  You may follow-up with your primary care physician.

## 2019-12-06 NOTE — ED Provider Notes (Signed)
[facility]    CSN: [id number] Arrival date & time: 12/06/19  1603      History   Chief Complaint Chief Complaint  Patient presents with  . Exposure to STD    HPI Philip York is a 29 y.o. male.   HPI  29 year old male presents with possible exposure to STD by his monogamous girlfriend.  States that about 1 week ago he noticed discharge from his penis that was noticed in his underwear.  He states that it was a large amount almost like an ejaculation.  He also had an irritation of his up with the penis which she stated was more like a numbness tingling sensation.  He denies any frequency urgency or dysuria.  He has not had any further penile discharge.  His girlfriend had noticed discharge as well and she was tested for STDs yesterday.  Her results have not been available.  He states he has had no symptoms since the initial presentation 1 week ago.        Past Medical History:  Diagnosis Date  . Hemorrhoids     There are no problems to display for this patient.   History reviewed. No pertinent surgical history.     Home Medications    Prior to Admission medications   Not on File    Family History History reviewed. No pertinent family history.  Social History Social History   Tobacco Use  . Smoking status: Former Smoker    Packs/day: 0.25    Types: Cigarettes  . Smokeless tobacco: Never Used  Substance Use Topics  . Alcohol use: No  . Drug use: Yes    Types: Marijuana    Comment: once daily     Allergies   Penicillins and Z-pak [azithromycin]   Review of Systems Review of Systems  Constitutional: Negative for activity change, appetite change, chills, fatigue and fever.  Genitourinary: Positive for discharge and penile pain. Negative for dysuria, frequency, genital sores, penile swelling, scrotal swelling, testicular pain and urgency.  All other systems reviewed and are negative.    Physical Exam Triage Vital Signs ED Triage  Vitals  Enc Vitals Group     BP 12/06/19 1628 137/73     Pulse Rate 12/06/19 1628 78     Resp 12/06/19 1628 18     Temp 12/06/19 1628 98.3 F (36.8 C)     Temp Source 12/06/19 1628 Oral     SpO2 12/06/19 1628 100 %     Weight 12/06/19 1625 (!) 320 lb (145.2 kg)     Height 12/06/19 1625 6' (1.829 m)     Head Circumference --      Peak Flow --      Pain Score 12/06/19 1625 0     Pain Loc --      Pain Edu? --      Excl. in [place]? --    No data found.  Updated Vital Signs BP 137/73 (BP Location: Right Arm)   Pulse 78   Temp 98.3 F (36.8 C) (Oral)   Resp 18   Ht 6' (1.829 m)   Wt (!) 320 lb (145.2 kg)   SpO2 100%   BMI 43.40 kg/m   Visual Acuity Right Eye Distance:   Left Eye Distance:   Bilateral Distance:    Right Eye Near:   Left Eye Near:    Bilateral Near:     Physical Exam Vitals and nursing note reviewed.  Constitutional:  General: He is not in acute distress.    Appearance: Normal appearance. He is obese. He is not ill-appearing or toxic-appearing.  HENT:     Head: Normocephalic and atraumatic.  Eyes:     Conjunctiva/sclera: Conjunctivae normal.  Genitourinary:    Penis: Normal.      Comments: Examination of the penis showed normal male phallus.  There is no rashes or sores noticed on the penis.  No discharge present and after milking the shaft was unable to produce any discharge.  There is no tenderness present. Musculoskeletal:        General: Normal range of motion.     Cervical back: Normal range of motion and neck supple.  Skin:    General: Skin is warm and dry.  Neurological:     General: No focal deficit present.     Mental Status: He is alert and oriented to person, place, and time.  Psychiatric:        Mood and Affect: Mood normal.        Behavior: Behavior normal.        Thought Content: Thought content normal.        Judgment: Judgment normal.      UC Treatments / Results  Labs (all labs ordered are listed, but only abnormal  results are displayed) Labs Reviewed  URINALYSIS, COMPLETE (UACMP) WITH MICROSCOPIC - Abnormal; Notable for the following components:      Result Value   Bacteria, UA FEW (*)    All other components within normal limits  CHLAMYDIA/NGC RT PCR [facility] ONLY)    EKG   Radiology No results found.  Procedures Procedures (including critical care time)  Medications Ordered in UC Medications - No data to display  Initial Impression / Assessment and Plan / UC Course  I have reviewed the triage vital signs and the nursing notes.  Pertinent labs & imaging results that were available during my care of the patient were reviewed by me and considered in my medical decision making (see chart for details).   29 year old male presents with exposure to STD by his monogamous girlfriend.  He had one episode of penile discharge about 1 week ago that has not recurred.  He also states that he had a tingling sensation on the tip of his penis once and has not recurred either.  Presently he does not complain of frequency urgency dysuria.  His last sexual contact with her was about 4 days ago.  She has tested for STDs last night and results are not available at this time.  Reviewed urinalysis with the patient showing no abnormalities.  We are sending a dirty urine for chlamydia and GC.  I have advised him with the lack of current symptoms that I will not to treat him empirically with medication.  He also has a allergy from penicillin specifically amoxicillin that has been handed down to him from childhood.  He does not know what reaction event he had.  He is in agreement with this he will contact the office tomorrow for results.  If they are positive he will return for treatment.  If negative no further examination is necessary.   Final Clinical Impressions(s) / UC Diagnoses   Final diagnoses:  Possible exposure to STD     Discharge Instructions     Your testing should be available tomorrow.  If  you have a positive result we will have you return for treatment.  If negative no further work-up is required.  You  may follow-up with your primary care physician.    ED Prescriptions    None     PDMP not reviewed this encounter.   Lutricia Feil, PA-C 12/06/19 [phone]

## 2019-12-06 NOTE — ED Triage Notes (Signed)
Patient c/o possible exposure to STD by his girlfriend. Patient states he has been having urinary frequency, dysuria and 1 episode of penile discharge.

## 2021-03-19 ENCOUNTER — Ambulatory Visit
Admission: EM | Admit: 2021-03-19 | Discharge: 2021-03-19 | Disposition: A | Discharge status: Home / Self Care | Payer: Self-pay | Attending: Emergency Medicine | Admitting: Emergency Medicine

## 2021-03-19 ENCOUNTER — Other Ambulatory Visit: Payer: Self-pay

## 2021-03-19 DIAGNOSIS — K0889 Other specified disorders of teeth and supporting structures: Secondary | ICD-10-CM

## 2021-03-19 MED ORDER — CLINDAMYCIN HCL 150 MG PO CAPS: 150.0000 mg | ORAL_CAPSULE | Freq: Three times a day (TID) | ORAL | 0 refills | Status: AC

## 2021-03-19 MED ORDER — TRAMADOL HCL 50 MG PO TABS: 100.0000 mg | ORAL_TABLET | Freq: Four times a day (QID) | ORAL | 0 refills | Status: DC | PRN

## 2021-03-19 NOTE — ED Triage Notes (Signed)
Pt c/o dental pain on the upper left since last night. Pt states that his mouth "feels hot" and cold water helps it feel better. Pt states he cannot eat hot foods because the pain makes him feel like he is going to pass out.

## 2021-03-19 NOTE — Discharge Instructions (Addendum)
Take the clindamycin 3 times a day for 7 days with food.  Take an over-the-counter probiotic 1 hour after shows antibiotic to prevent diarrhea from occurring.  Use Tylenol and ibuprofen as needed for mild to moderate pain and use the tramadol as needed for severe pain.  Do not operate machinery if you take it and do not drink alcohol or drive if you take it as it would make you sedated.  Reviewed list of dental resources in your discharge paperwork and because of an appointment to see dentist for evaluation of your dental pain.

## 2021-03-19 NOTE — ED Provider Notes (Signed)
[facility]    CSN: [id number] Arrival date & time: 03/19/21  1828      History   Chief Complaint Chief Complaint  Patient presents with   Dental Pain    Upper left    HPI Philip York is a 30 y.o. male.   HPI  30 year old male here for evaluation of dental pain.  Patient reports that he started to notice some pain in the left upper teeth last night.  When he woke up this morning he had more pain and he thought that he noted some swelling to his left cheek.  He states his mouth feels hot and not drinking cold water helps.  He states that eating hot foods makes it feel worse.  He did have some pain this afternoon that shot through his left temple he reports but that has resolved.  Patient does have a history of dental issues but he does not have a dentist at present.  He states that he has not had any drainage from around the tooth, fever, is not complaining of burning to his tongue or other parts of his mouth.  Past Medical History:  Diagnosis Date   Hemorrhoids     There are no problems to display for this patient.   History reviewed. No pertinent surgical history.     Home Medications    Prior to Admission medications   Medication Sig Start Date End Date Taking? Authorizing Provider  clindamycin (CLEOCIN) 150 MG capsule Take 1 capsule (150 mg total) by mouth 3 (three) times daily for 7 days. 03/19/21 03/26/21 Yes Becky Augusta, NP  traMADol (ULTRAM) 50 MG tablet Take 2 tablets (100 mg total) by mouth every 6 (six) hours as needed. 03/19/21  Yes Becky Augusta, NP    Family History History reviewed. No pertinent family history.  Social History Social History   Tobacco Use   Smoking status: Former    Packs/day: 0.25    Pack years: 0.00    Types: Cigarettes   Smokeless tobacco: Never  Vaping Use   Vaping Use: Never used  Substance Use Topics   Alcohol use: No   Drug use: Yes    Types: Marijuana    Comment: once daily     Allergies   Azithromycin  and Penicillins   Review of Systems Review of Systems  Constitutional:  Negative for activity change, appetite change and fever.  HENT:  Positive for dental problem. Negative for facial swelling.     Physical Exam Triage Vital Signs ED Triage Vitals  Enc Vitals Group     BP 03/19/21 1849 (!) 172/96     Pulse Rate 03/19/21 1849 64     Resp 03/19/21 1849 18     Temp 03/19/21 1849 (!) 97.5 F (36.4 C)     Temp Source 03/19/21 1849 Oral     SpO2 03/19/21 1849 98 %     Weight 03/19/21 1847 300 lb (136.1 kg)     Height 03/19/21 1847 6' (1.829 m)     Head Circumference --      Peak Flow --      Pain Score 03/19/21 1847 10     Pain Loc --      Pain Edu? --      Excl. in [place]? --    No data found.  Updated Vital Signs BP (!) 172/96 (BP Location: Left Arm)   Pulse 64   Temp (!) 97.5 F (36.4 C) (Oral)   Resp 18  Ht 6' (1.829 m)   Wt 300 lb (136.1 kg)   SpO2 98%   BMI 40.69 kg/m   Visual Acuity Right Eye Distance:   Left Eye Distance:   Bilateral Distance:    Right Eye Near:   Left Eye Near:    Bilateral Near:     Physical Exam Vitals and nursing note reviewed.  Constitutional:      General: He is not in acute distress.    Appearance: Normal appearance. He is obese. He is not ill-appearing.  HENT:     Head: Normocephalic and atraumatic.     Mouth/Throat:     Mouth: Mucous membranes are moist.     Pharynx: Oropharynx is clear. No oropharyngeal exudate or posterior oropharyngeal erythema.  Skin:    General: Skin is warm and dry.     Capillary Refill: Capillary refill takes less than 2 seconds.     Findings: No erythema or rash.  Neurological:     General: No focal deficit present.     Mental Status: He is alert and oriented to person, place, and time.  Psychiatric:        Mood and Affect: Mood normal.        Behavior: Behavior normal.        Thought Content: Thought content normal.        Judgment: Judgment normal.     UC Treatments / Results   Labs (all labs ordered are listed, but only abnormal results are displayed) Labs Reviewed - No data to display  EKG   Radiology No results found.  Procedures Procedures (including critical care time)  Medications Ordered in UC Medications - No data to display  Initial Impression / Assessment and Plan / UC Course  I have reviewed the triage vital signs and the nursing notes.  Pertinent labs & imaging results that were available during my care of the patient were reviewed by me and considered in my medical decision making (see chart for details).  Patient is a nontoxic-appearing 30 year old male here for evaluation of dental pain as outlined in HPI above.  Patient's physical exam reveals moderately poor dentition with missing teeth on the bottom left.  Patient's first molar on the upper left is tender to percussion with a tongue blade.  There is no bogginess or tenderness to the hard palate or the lateral aspect of the gum tissue on the buccal side.  There is no erythema the gum tissue and there is no drainage from around the tooth.  There is no erythema on either inner cheek, the tongue, roof of the mouth, or floor of the mouth.  With the tenderness to percussion suspect patient may very well have an apical abscess.  We will treat with antibiotics and have patient follow-up with dental.  A list of dental options has been provided.   Final Clinical Impressions(s) / UC Diagnoses   Final diagnoses:  Pain, dental     Discharge Instructions      Take the clindamycin 3 times a day for 7 days with food.  Take an over-the-counter probiotic 1 hour after shows antibiotic to prevent diarrhea from occurring.  Use Tylenol and ibuprofen as needed for mild to moderate pain and use the tramadol as needed for severe pain.  Do not operate machinery if you take it and do not drink alcohol or drive if you take it as it would make you sedated.  Reviewed list of dental resources in your discharge  paperwork and because  of an appointment to see dentist for evaluation of your dental pain.     ED Prescriptions     Medication Sig Dispense Auth. Provider   traMADol (ULTRAM) 50 MG tablet Take 2 tablets (100 mg total) by mouth every 6 (six) hours as needed. 15 tablet Becky Augusta, NP   clindamycin (CLEOCIN) 150 MG capsule Take 1 capsule (150 mg total) by mouth 3 (three) times daily for 7 days. 21 capsule Becky Augusta, NP      I have reviewed the PDMP during this encounter.   Becky Augusta, NP 03/19/21 Windell Moment

## 2022-03-09 ENCOUNTER — Ambulatory Visit
Admission: EM | Admit: 2022-03-09 | Discharge: 2022-03-09 | Disposition: A | Discharge status: Home / Self Care | Payer: Self-pay | Attending: Emergency Medicine | Admitting: Emergency Medicine

## 2022-03-09 DIAGNOSIS — S025XXB Fracture of tooth (traumatic), initial encounter for open fracture: Secondary | ICD-10-CM

## 2022-03-09 MED ORDER — CLINDAMYCIN HCL 300 MG PO CAPS: 300.0000 mg | ORAL_CAPSULE | Freq: Three times a day (TID) | ORAL | 0 refills | Status: AC

## 2022-03-09 MED ORDER — TRAMADOL HCL 50 MG PO TABS: 100.0000 mg | ORAL_TABLET | Freq: Four times a day (QID) | ORAL | 0 refills | Status: AC | PRN

## 2022-03-09 NOTE — ED Provider Notes (Signed)
[facility]    CSN: [id number] Arrival date & time: 03/09/22  1611      History   Chief Complaint Chief Complaint  Patient presents with   Dental Pain    HPI Philip York is a 31 y.o. male.   HPI  31 year old male here for evaluation of dental pain.  Patient reports that he woke up this morning and noticed that he had broken off part of his molar in his sleep on the upper left.  He states that as the days progressed he developed pain in his tooth but he denies any drainage or fever.  Past Medical History:  Diagnosis Date   Hemorrhoids     There are no problems to display for this patient.   History reviewed. No pertinent surgical history.     Home Medications    Prior to Admission medications   Medication Sig Start Date End Date Taking? Authorizing Provider  clindamycin (CLEOCIN) 300 MG capsule Take 1 capsule (300 mg total) by mouth 3 (three) times daily for 7 days. 03/09/22 03/16/22 Yes Becky Augusta, NP  traMADol (ULTRAM) 50 MG tablet Take 2 tablets (100 mg total) by mouth every 6 (six) hours as needed. 03/09/22  Yes Becky Augusta, NP    Family History History reviewed. No pertinent family history.  Social History Social History   Tobacco Use   Smoking status: Former    Packs/day: 0.25    Types: Cigarettes   Smokeless tobacco: Never  Vaping Use   Vaping Use: Never used  Substance Use Topics   Alcohol use: No   Drug use: Yes    Types: Marijuana    Comment: once daily     Allergies   Azithromycin and Penicillins   Review of Systems Review of Systems  Constitutional:  Negative for fever.  HENT:  Positive for dental problem. Negative for facial swelling.      Physical Exam Triage Vital Signs ED Triage Vitals  Enc Vitals Group     BP 03/09/22 1626 (!) 161/96     Pulse Rate 03/09/22 1626 63     Resp 03/09/22 1626 19     Temp 03/09/22 1626 98 F (36.7 C)     Temp Source 03/09/22 1626 Oral     SpO2 03/09/22 1626 100 %     Weight  --      Height --      Head Circumference --      Peak Flow --      Pain Score 03/09/22 1624 8     Pain Loc --      Pain Edu? --      Excl. in [place]? --    No data found.  Updated Vital Signs BP (!) 161/96 (BP Location: Left Arm)   Pulse 63   Temp 98 F (36.7 C) (Oral)   Resp 19   SpO2 100%   Visual Acuity Right Eye Distance:   Left Eye Distance:   Bilateral Distance:    Right Eye Near:   Left Eye Near:    Bilateral Near:     Physical Exam Vitals and nursing note reviewed.  Constitutional:      Appearance: Normal appearance. He is not ill-appearing.  HENT:     Mouth/Throat:     Mouth: Mucous membranes are moist.     Pharynx: Oropharynx is clear. No oropharyngeal exudate or posterior oropharyngeal erythema.  Skin:    General: Skin is warm and dry.     Capillary  Refill: Capillary refill takes less than 2 seconds.     Findings: No erythema or rash.  Neurological:     General: No focal deficit present.     Mental Status: He is alert and oriented to person, place, and time.  Psychiatric:        Mood and Affect: Mood normal.        Behavior: Behavior normal.        Thought Content: Thought content normal.        Judgment: Judgment normal.      UC Treatments / Results  Labs (all labs ordered are listed, but only abnormal results are displayed) Labs Reviewed - No data to display  EKG   Radiology No results found.  Procedures Procedures (including critical care time)  Medications Ordered in UC Medications - No data to display  Initial Impression / Assessment and Plan / UC Course  I have reviewed the triage vital signs and the nursing notes.  Pertinent labs & imaging results that were available during my care of the patient were reviewed by me and considered in my medical decision making (see chart for details).  Very pleasant, nontoxic-appearing 31 year old male here for evaluation of a broken left upper first molar that occurred in the middle the night.   Patient reports that he grinds his teeth in his sleep and does not wear a mouthguard.  He woke up this morning to find that his tooth had broken and he does have the missing fragment at home.  On exam patient does have a broken left upper first molar that has a loose fragment and a fixed fragment still in the gum tissue.  The dentin is exposed.  He states that he went to a dentist in town but they did not have any appointments available today.  I have suggested that he go over to [facility] on [address] as they do take walk-in appointments.  I will discharge him home on tramadol for pain and clindamycin to help prevent infection.   Final Clinical Impressions(s) / UC Diagnoses   Final diagnoses:  Open fracture of tooth, initial encounter     Discharge Instructions      Take clindamycin 3 times a day for 7 days to prevent infection.  Use over-the-counter Tylenol and ibuprofen as needed for mild to moderate pain.  Use the tramadol as needed for severe pain.  Eat soft foods and rinse your mouth after meals and at bedtime with either warm salt water or Listerine.  You need to have that tooth extracted and I recommend you go to [facility] here in [place] as they do take walk-ins.  You may also want to consider going to [facility] to be evaluated by the dentist on-call.     ED Prescriptions     Medication Sig Dispense Auth. Provider   traMADol (ULTRAM) 50 MG tablet Take 2 tablets (100 mg total) by mouth every 6 (six) hours as needed. 21 tablet Becky Augusta, NP   clindamycin (CLEOCIN) 300 MG capsule Take 1 capsule (300 mg total) by mouth 3 (three) times daily for 7 days. 21 capsule Becky Augusta, NP      I have reviewed the PDMP during this encounter.   Becky Augusta, NP 03/09/22 1644

## 2022-03-09 NOTE — ED Triage Notes (Signed)
Pt reports dental pain x 1 day States he broke his tooth by gritting his teeth in his sleep.  New to the area and does not have a Education officer, community.
